# Patient Record
Sex: Male | Born: 1990 | Race: Black or African American | Hispanic: No | Marital: Single | Smoking: Never smoker
Health system: Southern US, Community
[De-identification: ages and names within clinical notes are randomized; demographics above are authoritative.]

## PROBLEM LIST (undated history)

## (undated) DIAGNOSIS — T782XXA Anaphylactic shock, unspecified, initial encounter: Secondary | ICD-10-CM

---

## 2018-01-22 ENCOUNTER — Emergency Department
Admission: EM | Admit: 2018-01-22 | Discharge: 2018-01-22 | Disposition: A | Discharge status: Home / Self Care | Payer: Self-pay | Attending: Emergency Medicine | Admitting: Emergency Medicine

## 2018-01-22 ENCOUNTER — Other Ambulatory Visit: Payer: Self-pay

## 2018-01-22 ENCOUNTER — Encounter: Payer: Self-pay | Admitting: Emergency Medicine

## 2018-01-22 DIAGNOSIS — R070 Pain in throat: Secondary | ICD-10-CM | POA: Insufficient documentation

## 2018-01-22 DIAGNOSIS — T782XXA Anaphylactic shock, unspecified, initial encounter: Secondary | ICD-10-CM | POA: Insufficient documentation

## 2018-01-22 DIAGNOSIS — R0602 Shortness of breath: Secondary | ICD-10-CM | POA: Insufficient documentation

## 2018-01-22 DIAGNOSIS — T7840XA Allergy, unspecified, initial encounter: Secondary | ICD-10-CM

## 2018-01-22 DIAGNOSIS — T781XXA Other adverse food reactions, not elsewhere classified, initial encounter: Secondary | ICD-10-CM | POA: Insufficient documentation

## 2018-01-22 MED ORDER — FAMOTIDINE 20 MG PO TABS: 20.0000 mg | ORAL_TABLET | Freq: Two times a day (BID) | ORAL | 0 refills | Status: AC

## 2018-01-22 MED ORDER — DIPHENHYDRAMINE HCL 50 MG PO TABS: 50.0000 mg | ORAL_TABLET | Freq: Two times a day (BID) | ORAL | 0 refills | Status: AC

## 2018-01-22 MED ORDER — EPINEPHRINE 0.3 MG/0.3ML IJ SOAJ
0.3000 mg | Freq: Once | INTRAMUSCULAR | Status: AC
  Administered 2018-01-22: 0.3 mg via INTRAMUSCULAR
  Filled 2018-01-22: qty 0.3

## 2018-01-22 MED ORDER — DIPHENHYDRAMINE HCL 50 MG/ML IJ SOLN
50.0000 mg | Freq: Once | INTRAMUSCULAR | Status: AC
Start: 2018-01-22 — End: 2018-01-22
  Administered 2018-01-22: 50 mg via INTRAVENOUS
  Filled 2018-01-22: qty 1

## 2018-01-22 MED ORDER — PREDNISONE 20 MG PO TABS
60.0000 mg | ORAL_TABLET | Freq: Once | ORAL | Status: AC
  Administered 2018-01-22: 60 mg via ORAL
  Filled 2018-01-22: qty 3

## 2018-01-22 MED ORDER — FAMOTIDINE 20 MG PO TABS
20.0000 mg | ORAL_TABLET | Freq: Once | ORAL | Status: AC
  Administered 2018-01-22: 20 mg via ORAL
  Filled 2018-01-22: qty 1

## 2018-01-22 MED ORDER — PREDNISONE 50 MG PO TABS: 50.0000 mg | ORAL_TABLET | Freq: Every day | ORAL | 0 refills | Status: AC

## 2018-01-22 MED ORDER — EPINEPHRINE 0.3 MG/0.3ML IJ SOAJ: 0.3000 mg | Freq: Once | INTRAMUSCULAR | 2 refills | Status: AC

## 2018-01-22 NOTE — ED Triage Notes (Signed)
Pt arrives ambulatory to triage with c/o allergic reaction due to some chinese food. Pt is able to talk in complete sentences at this time without distress. Pt reports that he took benadryl x2 about ten minutes ago. Pt is in NAD.

## 2018-01-22 NOTE — ED Notes (Signed)
Pt states he is allergic to shrimp and was eating chinese tonight that had shrimp in it. Pt states his throat felt tight and took 2 benadryl prior to arrival. No drooling noted, pt able to swallow without difficulty as well as talk in full sentences. Pt in NAD at this time.

## 2018-01-22 NOTE — ED Notes (Signed)

## 2018-01-22 NOTE — Discharge Instructions (Addendum)
Please do not ever have shrimp or any other shellfish again.  The next time you have an allergic reaction it could be very much worse than today.  Please establish care with primary care physician and return to the emergency department sooner for any concerns whatsoever.  It was a pleasure to take care of you today, and thank you for coming to our emergency department.  If you have any questions or concerns before leaving please ask the nurse to grab me and I'm more than happy to go through your aftercare instructions again.  If you were prescribed any opioid pain medication today such as Norco, Vicodin, Percocet, morphine, hydrocodone, or oxycodone please make sure you do not drive when you are taking this medication as it can alter your ability to drive safely.  If you have any concerns once you are home that you are not improving or are in fact getting worse before you can make it to your follow-up appointment, please do not hesitate to call 911 and come back for further evaluation.  Merrily Brittle, MD

## 2018-01-22 NOTE — ED Provider Notes (Signed)
[facility] Emergency Department Provider Note  ____________________________________________   First MD Initiated Contact with Patient 01/22/18 0017     (approximate)  I have reviewed the triage vital signs and the nursing notes.   HISTORY  Chief Complaint Allergic Reaction    HPI Paul Mckay is a 27 y.o. male who self presents to the emergency department with itching rash, shortness of breath, and throat "tightness" that began shortly after eating [place] food this evening.  He has a known shellfish allergy and when he did not eat any shrimp today he did eat food that was cooked with shrimp.  He began to become symptomatic about 10 minutes after eating the food.  He self administered 2 tablets of Benadryl with minimal relief.  His symptoms have been gradually progressive and are now moderate severity.  He denies abdominal pain nausea or vomiting.  History reviewed. No pertinent past medical history.  There are no active problems to display for this patient.   History reviewed. No pertinent surgical history.  Prior to Admission medications   Medication Sig Start Date End Date Taking? Authorizing Provider  diphenhydrAMINE (BENADRYL) 50 MG tablet Take 1 tablet (50 mg total) by mouth 2 (two) times daily for 5 days. 01/22/18 01/27/18  Merrily Brittle, MD  famotidine (PEPCID) 20 MG tablet Take 1 tablet (20 mg total) by mouth 2 (two) times daily for 5 days. 01/22/18 01/27/18  Merrily Brittle, MD  predniSONE (DELTASONE) 50 MG tablet Take 1 tablet (50 mg total) by mouth daily for 4 days. 01/22/18 01/26/18  Merrily Brittle, MD    Allergies Patient has no known allergies.  No family history on file.  Social History Social History   Tobacco Use  . Smoking status: Never Smoker  . Smokeless tobacco: Never Used  Substance Use Topics  . Alcohol use: Never    Frequency: Never  . Drug use: Never    Review of Systems Constitutional: No fever/chills Eyes: No visual  changes. ENT: Positive for sore throat. Cardiovascular: Denies chest pain. Respiratory: Positive for shortness of breath. Gastrointestinal: No abdominal pain.  No nausea, no vomiting.  No diarrhea.  No constipation. Genitourinary: Negative for dysuria. Musculoskeletal: Negative for back pain. Skin: Positive for rash. Neurological: Negative for headaches, focal weakness or numbness.   ____________________________________________   PHYSICAL EXAM:  VITAL SIGNS: ED Triage Vitals  Enc Vitals Group     BP 01/22/18 0015 (!) 151/65     Pulse Rate 01/22/18 0015 (!) 103     Resp 01/22/18 0015 18     Temp 01/22/18 0015 98.7 F (37.1 C)     Temp Source 01/22/18 0015 Oral     SpO2 01/22/18 0015 100 %     Weight 01/22/18 0016 180 lb (81.6 kg)     Height 01/22/18 0016  (1.702 m)     Head Circumference --      Peak Flow --      Pain Score 01/22/18 0015 0     Pain Loc --      Pain Edu? --      Excl. in GC? --     Constitutional: Alert and oriented x4 appears somewhat uncomfortable scratching his chest and his throat Eyes: PERRL EOMI. Head: Atraumatic. Nose: No congestion/rhinnorhea. Mouth/Throat: No trismus uvula midline no pharyngeal erythema or exudate Neck: No stridor.   Cardiovascular: Tachycardic rate, regular rhythm. Grossly normal heart sounds.  Good peripheral circulation. Respiratory: Increased respiratory effort.  No retractions. Lungs CTAB and  moving good air Gastrointestinal: Soft nontender Musculoskeletal: No lower extremity edema   Neurologic:  Normal speech and language. No gross focal neurologic deficits are appreciated. Skin: Faint erythema over anterior chest Psychiatric: Mildly anxious appearing    ____________________________________________   DIFFERENTIAL includes but not limited to  Anaphylaxis, allergic reaction, anxiety ____________________________________________   LABS (all labs ordered are listed, but only abnormal results are  displayed)  Labs Reviewed - No data to display   __________________________________________  EKG   ____________________________________________  RADIOLOGY   ____________________________________________   PROCEDURES  Procedure(s) performed: no  .Critical Care Performed by: Merrily Brittle, MD Authorized by: Merrily Brittle, MD   Critical care provider statement:    Critical care time (minutes):  30   Critical care time was exclusive of:  Separately billable procedures and treating other patients   Critical care was necessary to treat or prevent imminent or life-threatening deterioration of the following conditions: anaphylaxis.   Critical care was time spent personally by me on the following activities:  Development of treatment plan with patient or surrogate, discussions with consultants, evaluation of patient's response to treatment, examination of patient, obtaining history from patient or surrogate, ordering and performing treatments and interventions, ordering and review of laboratory studies, ordering and review of radiographic studies, pulse oximetry, re-evaluation of patient's condition and review of old charts    Critical Care performed: Yes  Observation: no ____________________________________________   INITIAL IMPRESSION / ASSESSMENT AND PLAN / ED COURSE  Pertinent labs & imaging results that were available during my care of the patient were reviewed by me and considered in my medical decision making (see chart for details).  ----------------------------------------- 12:22 AM on 01/22/2018 -----------------------------------------  While the patient has no wheeze he feels subjectively short of breath and significantly itchy.  He is young and healthy with no medical issues.  I will give him a dose of epinephrine now.    ----------------------------------------- 12:43 AM on 01/22/2018 -----------------------------------------  After the EpiPen the  patient's symptoms are nearly completely resolved.  Will observe an hour to discharge home  ____________________________________________ After observation the patient has had no recurrent.  Discharged home with strict return precautions.  FINAL CLINICAL IMPRESSION(S) / ED DIAGNOSES  Final diagnoses:  Allergic reaction, initial encounter  Anaphylaxis, initial encounter      NEW MEDICATIONS STARTED DURING THIS VISIT:  Discharge Medication List as of 01/22/2018  1:35 AM    START taking these medications   Details  diphenhydrAMINE (BENADRYL) 50 MG tablet Take 1 tablet (50 mg total) by mouth 2 (two) times daily for 5 days., Starting Thu 01/22/2018, Until Tue 01/27/2018, Print    EPINEPHrine (ADRENACLICK) 0.3 mg/0.3 mL IJ SOAJ injection Inject 0.3 mLs (0.3 mg total) into the muscle once for 1 dose., Starting Thu 01/22/2018, Print    famotidine (PEPCID) 20 MG tablet Take 1 tablet (20 mg total) by mouth 2 (two) times daily for 5 days., Starting Thu 01/22/2018, Until Tue 01/27/2018, Print    predniSONE (DELTASONE) 50 MG tablet Take 1 tablet (50 mg total) by mouth daily for 4 days., Starting Thu 01/22/2018, Until Mon 01/26/2018, Print         Note:  This document was prepared using Dragon voice recognition software and may include unintentional dictation errors.     Merrily Brittle, MD 01/23/18 [phone]

## 2018-02-07 ENCOUNTER — Emergency Department
Admission: EM | Admit: 2018-02-07 | Discharge: 2018-02-07 | Disposition: A | Discharge status: Home / Self Care | Payer: Self-pay | Attending: Emergency Medicine | Admitting: Emergency Medicine

## 2018-02-07 ENCOUNTER — Encounter: Payer: Self-pay | Admitting: Emergency Medicine

## 2018-02-07 ENCOUNTER — Other Ambulatory Visit: Payer: Self-pay

## 2018-02-07 ENCOUNTER — Emergency Department: Payer: Self-pay

## 2018-02-07 DIAGNOSIS — T7840XA Allergy, unspecified, initial encounter: Secondary | ICD-10-CM | POA: Insufficient documentation

## 2018-02-07 HISTORY — DX: Anaphylactic shock, unspecified, initial encounter: T78.2XXA

## 2018-02-07 LAB — CBC
HEMATOCRIT: 42.7 % (ref 40.0–52.0)
Hemoglobin: 14.4 g/dL (ref 13.0–18.0)
MCH: 27.4 pg (ref 26.0–34.0)
MCHC: 33.7 g/dL (ref 32.0–36.0)
MCV: 81.2 fL (ref 80.0–100.0)
PLATELETS: 324 10*3/uL (ref 150–440)
RBC: 5.26 MIL/uL (ref 4.40–5.90)
RDW: 13.6 % (ref 11.5–14.5)
WBC: 6.9 10*3/uL (ref 3.8–10.6)

## 2018-02-07 LAB — BASIC METABOLIC PANEL
Anion gap: 9 (ref 5–15)
BUN: 14 mg/dL (ref 6–20)
CHLORIDE: 103 mmol/L (ref 101–111)
CO2: 26 mmol/L (ref 22–32)
Calcium: 9 mg/dL (ref 8.9–10.3)
Creatinine, Ser: 1 mg/dL (ref 0.61–1.24)
Glucose, Bld: 164 mg/dL — ABNORMAL HIGH (ref 65–99)
POTASSIUM: 3.4 mmol/L — AB (ref 3.5–5.1)
SODIUM: 138 mmol/L (ref 135–145)

## 2018-02-07 LAB — TROPONIN I

## 2018-02-07 MED ORDER — EPINEPHRINE 0.3 MG/0.3ML IJ SOAJ: 0.3000 mg | Freq: Once | INTRAMUSCULAR | 0 refills | Status: AC

## 2018-02-07 MED ORDER — METHYLPREDNISOLONE SODIUM SUCC 125 MG IJ SOLR: 125.0000 mg | Freq: Once | INTRAMUSCULAR | Status: DC

## 2018-02-07 MED ORDER — DIPHENHYDRAMINE HCL 50 MG/ML IJ SOLN: 25.0000 mg | INTRAMUSCULAR | Status: DC

## 2018-02-07 NOTE — Discharge Instructions (Signed)
You have been seen in the Emergency Department (ED) today for an allergic reaction.  You have been stable throughout your stay in the Emergency Department.  Please take any prescribed medications and follow up with your doctor as indicated.  Take over-the-counter Benadryl as needed for the next few days.  Please keep your Epi-Pen with you at all times and use it if experience shortness of breath or difficulty breathing or if you believe you are having a severe allergic reaction.  If you use the Epi-Pen, though, please call 911 afterwards or go immediately to your nearest Emergency Department.  Return to the Emergency Department (ED) if you experience any worsening or new symptoms that concern you.

## 2018-02-07 NOTE — ED Provider Notes (Signed)
[facility] Emergency Department Provider Note  ____________________________________________   First MD Initiated Contact with Patient 02/07/18 [phone]     (approximate)  I have reviewed the triage vital signs and the nursing notes.   HISTORY  Chief Complaint Allergic Reaction    HPI Paul Mckay is a 27 y.o. 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  He states he was eating some take out fried chicken from [employer] when he began to feel short of breath with some chest tightness.  It is unclear if he started to feel ill before he found out that the oil the use for the check and may be also used for fried shrimp or after.  He states that he felt like his throat was tightening, his chest was tight, and he was having a difficult time breathing and his throat was itching.  He has an EpiPen at home and he had his friend administer it.  He then came to the emergency department as he was told to do.  He also took what he thinks was Benadryl but he knows were some kind of allergy pill.  He currently feels asymptomatic.  He states that he felt better as soon as he took the medications and has not had a recurrence of symptoms.  He has no difficulty breathing, no nausea, no vomiting, no abdominal pain, no diarrhea.  He feels no swelling anywhere.  He never developed any rash.  The onset of the symptoms was acute and severe but completely resolved.  Past Medical History:  Diagnosis Date  . Anaphylactic reaction    shrimp    There are no active problems to display for this patient.   History reviewed. No pertinent surgical history.  Prior to Admission medications   Medication Sig Start Date End Date Taking? Authorizing Provider  diphenhydrAMINE (BENADRYL) 50 MG tablet Take 1 tablet (50 mg total) by mouth 2 (two) times daily for 5 days. 01/22/18 01/27/18  Merrily Brittle, MD  EPINEPHrine  (EPIPEN 2-PAK) 0.3 mg/0.3 mL IJ SOAJ injection Inject 0.3 mLs (0.3 mg total) into the muscle once for 1 dose. Take for severe allergic reaction, then come immediately to the Emergency Department or call 911. 02/07/18 02/07/18  Loleta Rose, MD  famotidine (PEPCID) 20 MG tablet Take 1 tablet (20 mg total) by mouth 2 (two) times daily for 5 days. 01/22/18 01/27/18  Merrily Brittle, MD    Allergies Patient has no known allergies.  History reviewed. No pertinent family history.  Social History Social History   Tobacco Use  . Smoking status: Never Smoker  . Smokeless tobacco: Never Used  Substance Use Topics  . Alcohol use: Never    Frequency: Never  . Drug use: Never    Review of Systems Constitutional: No fever/chills Eyes: No visual changes. ENT: No sore throat. Cardiovascular: Chest tightness as described above Respiratory: Shortness of breath as described above Gastrointestinal: No abdominal pain.  No nausea, no vomiting.  No diarrhea.  No constipation. Genitourinary: Negative for dysuria. Musculoskeletal: Negative for neck pain.  Negative for back pain. Integumentary: Negative for rash. Neurological: Negative for headaches, focal weakness or numbness.   ____________________________________________   PHYSICAL EXAM:  VITAL SIGNS: ED Triage Vitals  Enc Vitals Group     BP 02/07/18 0118 125/85     Pulse Rate 02/07/18 0118 78     Resp 02/07/18 0118 12     Temp 02/07/18 0118 98.5 F (  36.9 C)     Temp Source 02/07/18 0118 Oral     SpO2 02/07/18 0118 100 %     Weight 02/07/18 0119 81.6 kg (180 lb)     Height 02/07/18 0119 1.702 m ( )     Head Circumference --      Peak Flow --      Pain Score 02/07/18 0118 6     Pain Loc --      Pain Edu? --      Excl. in GC? --     Constitutional: Alert and oriented. Well appearing and in no acute distress. Eyes: Conjunctivae are normal.  Head: Atraumatic. Nose: No congestion/rhinnorhea. Mouth/Throat: Mucous membranes are  moist.  Oropharynx non-erythematous.  No oral lesions Neck: No stridor.  No meningeal signs.   Cardiovascular: Normal rate, regular rhythm. Good peripheral circulation. Grossly normal heart sounds. Respiratory: Normal respiratory effort.  No retractions. Lungs CTAB. Gastrointestinal: Soft and nontender. No distention.  Musculoskeletal: No lower extremity tenderness nor edema. No gross deformities of extremities. Neurologic:  Normal speech and language. No gross focal neurologic deficits are appreciated.  Skin:  Skin is warm, dry and intact. No rash noted. Psychiatric: Mood and affect are normal. Speech and behavior are normal.  ____________________________________________   LABS (all labs ordered are listed, but only abnormal results are displayed)  Labs Reviewed  BASIC METABOLIC PANEL - Abnormal; Notable for the following components:      Result Value   Potassium 3.4 (*)    Glucose, Bld 164 (*)    All other components within normal limits  CBC  TROPONIN I   ____________________________________________  EKG  ED ECG REPORT I, Loleta Rose, the attending physician, personally viewed and interpreted this ECG.  Date: 02/07/2018 EKG Time: 1:20 AM Rate: 80 Rhythm: normal sinus rhythm QRS Axis: normal Intervals: normal ST/T Wave abnormalities: normal Narrative Interpretation: no evidence of acute ischemia  ____________________________________________  RADIOLOGY I, Loleta Rose, personally viewed and evaluated these images (plain radiographs) as part of my medical decision making, as well as reviewing the written report by the radiologist.  ED MD interpretation:  no acute abnormalities  Official radiology report(s): Dg Chest 2 View  Result Date: 02/07/2018 CLINICAL DATA:  Acute onset of allergic reaction, with generalized chest tightness and itching. EXAM: CHEST - 2 VIEW COMPARISON:  None. FINDINGS: The lungs are well-aerated and clear. There is no evidence of focal  opacification, pleural effusion or pneumothorax. The heart is normal in size; the mediastinal contour is within normal limits. No acute osseous abnormalities are seen. IMPRESSION: No acute cardiopulmonary process seen. Electronically Signed   By: Roanna Raider M.D.   On: 02/07/2018 02:04    ____________________________________________   PROCEDURES  Critical Care performed: No   Procedure(s) performed:   Procedures   ____________________________________________   INITIAL IMPRESSION / ASSESSMENT AND PLAN / ED COURSE  As part of my medical decision making, I reviewed the following data within the electronic MEDICAL RECORD NUMBER Nursing notes reviewed and incorporated, Labs reviewed , EKG interpreted  and Notes from prior ED visits    The main items on the differential diagnosis include anaphylactic reaction and panic attack when he thought he may be having a reaction.  The patient is completely asymptomatic at this time.  We have observed him in the emergency department for about an hour and 45 minutes.  I will continue to watch him but at this time I am wondering if he had more of a panic attack  than an actual anaphylactic reaction.  Given that he is completely asymptomatic now and had no other signs or symptoms, I will hold off on any steroid treatments or additional medication.  They are immediately available if he needs it.  He understands the plan for additional observation.  Clinical Course as of Feb 08 432  Sat Feb 07, 2018  0430 Patient has now been here about 3 and half hours and has had absolutely no symptoms.  I think that he might of had more of a panic attack than actual allergic reaction.  He is comfortable with the plan for discharge and outpatient follow-up.  I gave my usual and customary return precautions.    [CF]    Clinical Course User Index [CF] Loleta Rose, MD    ____________________________________________  FINAL CLINICAL IMPRESSION(S) / ED DIAGNOSES  Final  diagnoses:  Allergic reaction, initial encounter     MEDICATIONS GIVEN DURING THIS VISIT:  Medications - No data to display   ED Discharge Orders        Ordered    EPINEPHrine (EPIPEN 2-PAK) 0.3 mg/0.3 mL IJ SOAJ injection   Once     02/07/18 0431       Note:  This document was prepared using Dragon voice recognition software and may include unintentional dictation errors.    Loleta Rose, MD 02/07/18 [phone]

## 2018-02-07 NOTE — ED Triage Notes (Signed)
Pt arrives ambulatory to triage with c/o allergic reaction. Pt reports that he was eating some fried chicken from [employer] and experienced some itching and possible anaphylaxis. Pt reports that he took his epi-pen as prescribed. Pt is still experiencing sone chest tightness at this time. Pt is in NAD.

## 2019-12-04 IMAGING — CR DG CHEST 2V
1 series · 2 of 2 positions shown · non-contrast
Comparison: None.

CLINICAL DATA: Acute onset of allergic reaction, with generalized
chest tightness and itching.

EXAM:
CHEST - 2 VIEW

[Series 1: dg chest 2 view · 0.14mm/px · 2 of 2 slices shown]
[im 1/2]
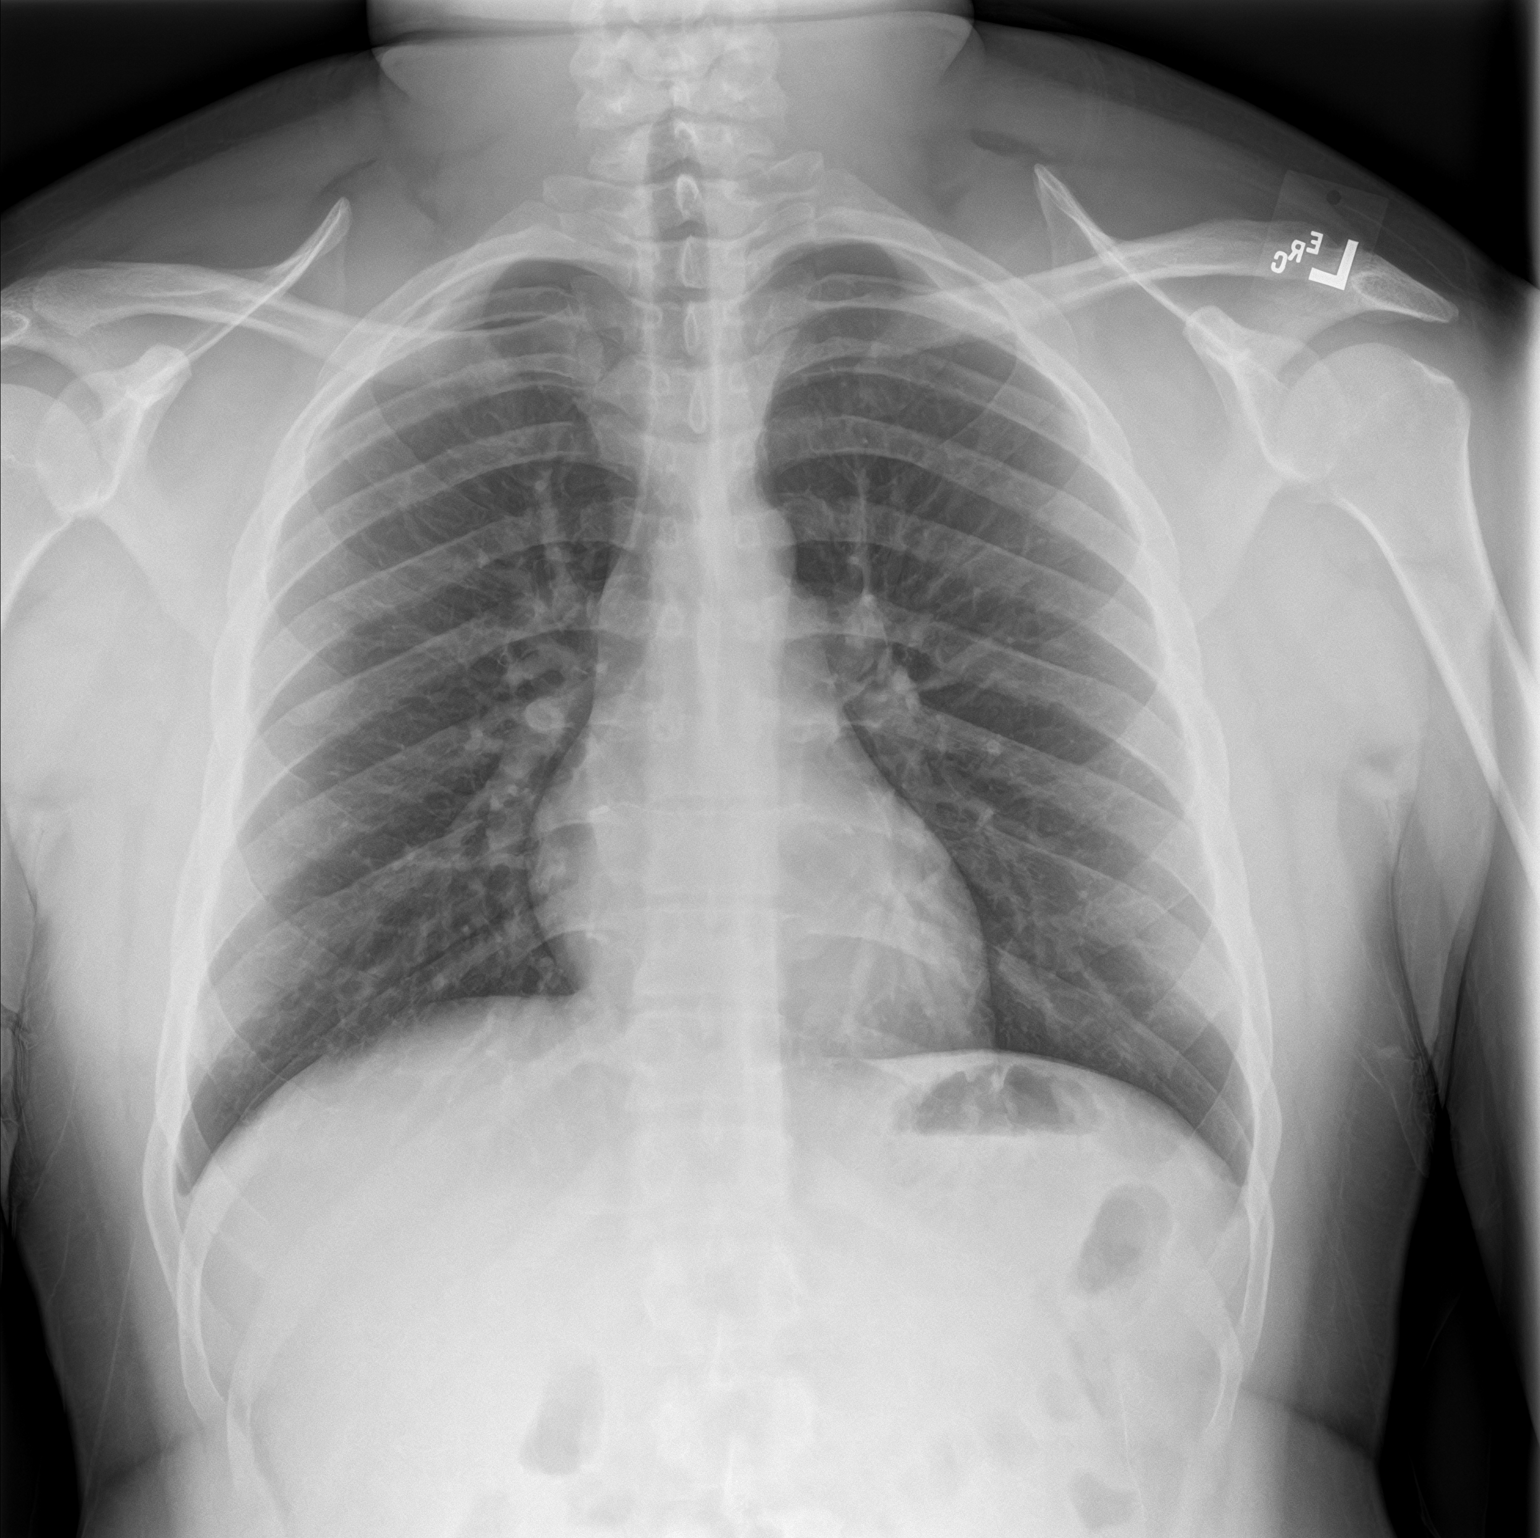
[im 2/2]
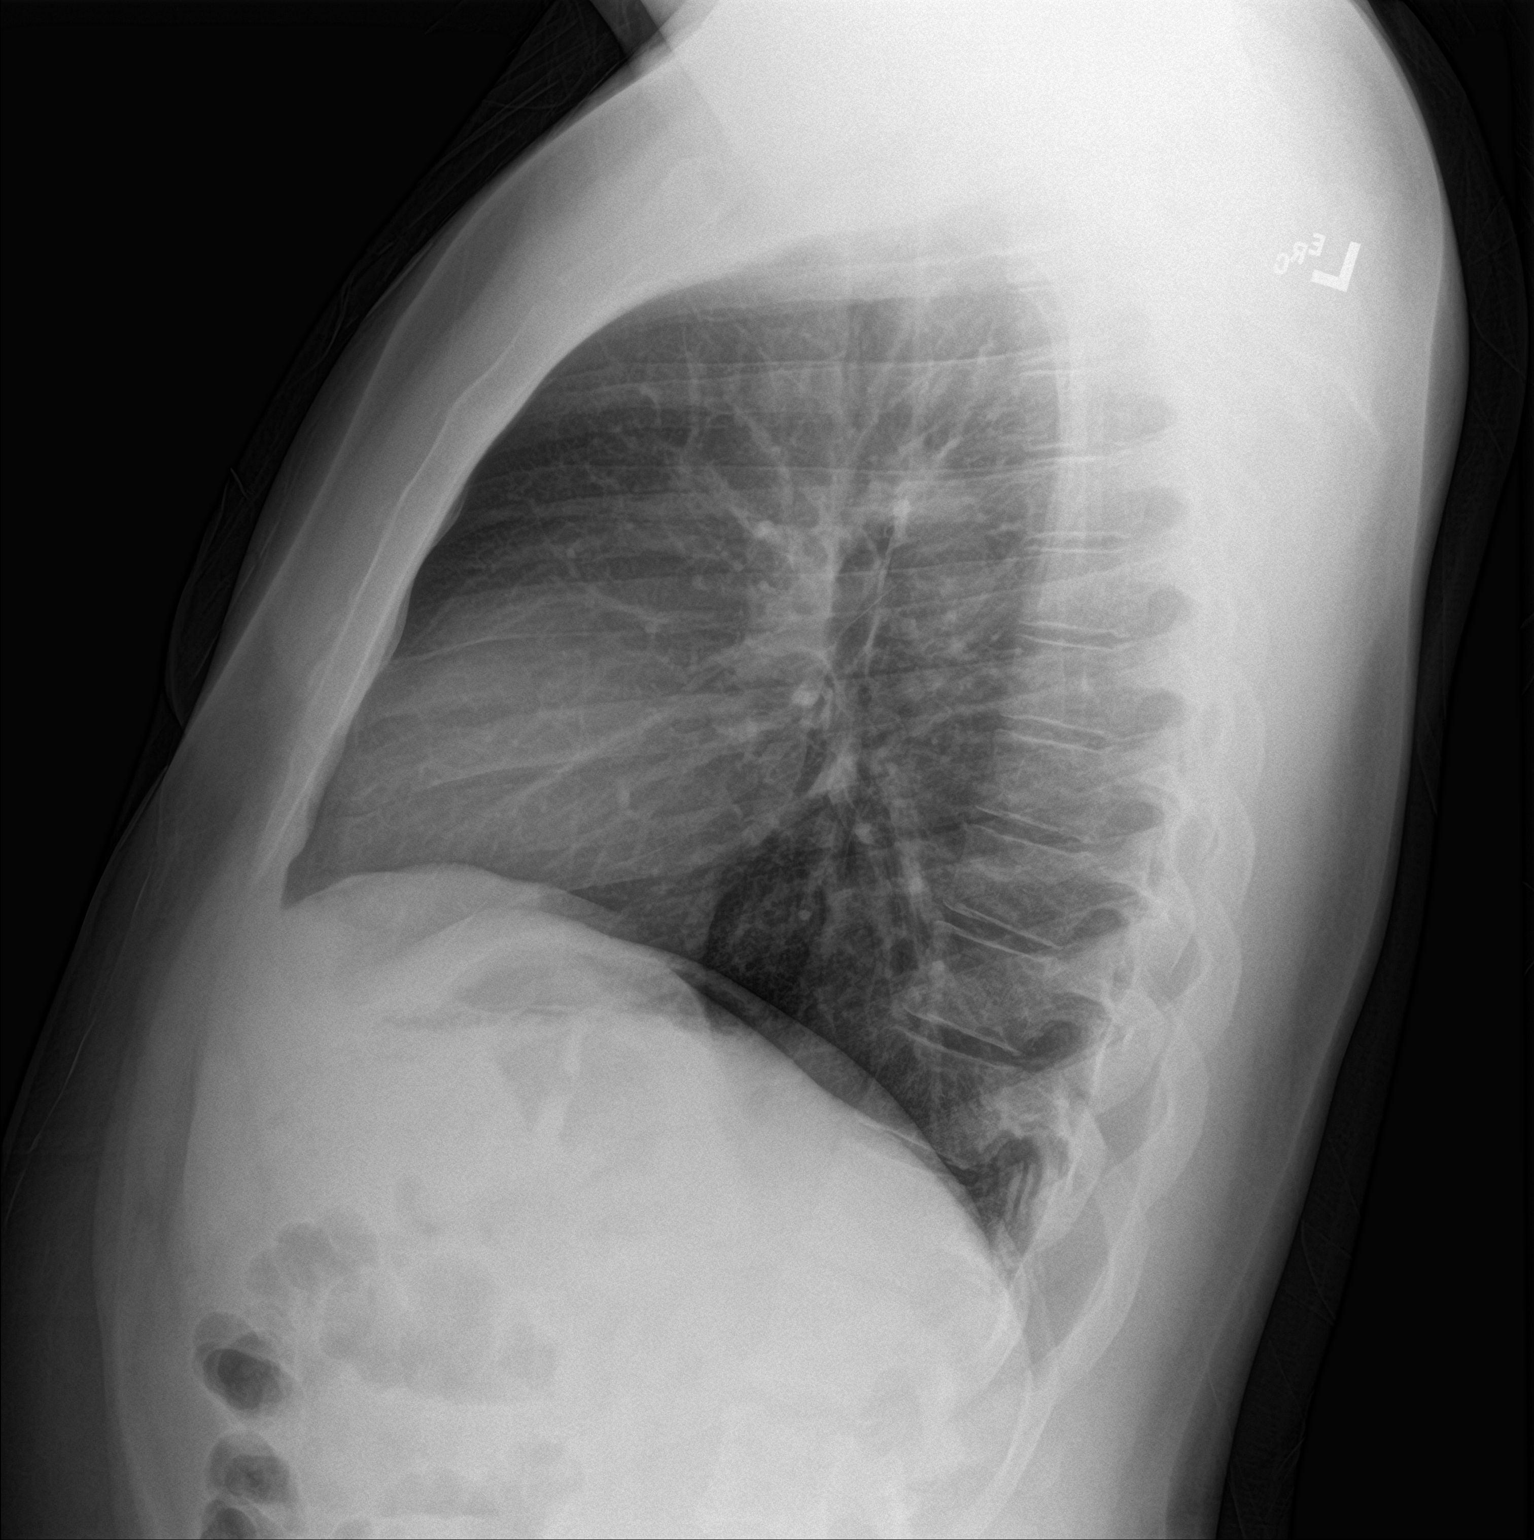

[2 of 2 positions shown; findings below may reference images not displayed]

FINDINGS: The lungs are well-aerated and clear. There is no evidence of focal
opacification, pleural effusion or pneumothorax.

The heart is normal in size; the mediastinal contour is within
normal limits. No acute osseous abnormalities are seen.
IMPRESSION: No acute cardiopulmonary process seen.
